# Patient Record
Sex: Female | Born: 2005 | Race: Black or African American | Hispanic: No | Marital: Single
Health system: Southern US, Community
[De-identification: ages and names within clinical notes are randomized; demographics above are authoritative.]

## PROBLEM LIST (undated history)

## (undated) DIAGNOSIS — J020 Streptococcal pharyngitis: Secondary | ICD-10-CM

## (undated) DIAGNOSIS — J45909 Unspecified asthma, uncomplicated: Secondary | ICD-10-CM

---
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
## 2023-11-16 ENCOUNTER — Emergency Department (HOSPITAL_BASED_OUTPATIENT_CLINIC_OR_DEPARTMENT_OTHER)

## 2023-11-16 ENCOUNTER — Emergency Department (HOSPITAL_BASED_OUTPATIENT_CLINIC_OR_DEPARTMENT_OTHER)
Admission: EM | Admit: 2023-11-16 | Discharge: 2023-11-16 | Disposition: A | Discharge status: Home / Self Care | Attending: Emergency Medicine | Admitting: Emergency Medicine

## 2023-11-16 ENCOUNTER — Other Ambulatory Visit: Payer: Self-pay

## 2023-11-16 ENCOUNTER — Encounter (HOSPITAL_BASED_OUTPATIENT_CLINIC_OR_DEPARTMENT_OTHER): Payer: Self-pay | Admitting: Emergency Medicine

## 2023-11-16 DIAGNOSIS — R059 Cough, unspecified: Secondary | ICD-10-CM | POA: Insufficient documentation

## 2023-11-16 LAB — RESP PANEL BY RT-PCR (RSV, FLU A&B, COVID)  RVPGX2
Influenza A by PCR: NEGATIVE
Influenza B by PCR: NEGATIVE
Resp Syncytial Virus by PCR: NEGATIVE
SARS Coronavirus 2 by RT PCR: NEGATIVE

## 2023-11-16 MED ORDER — GUAIFENESIN 100 MG/5ML PO LIQD: 100.0000 mg | ORAL | 0 refills | Status: AC | PRN

## 2023-11-16 NOTE — ED Provider Notes (Signed)
 [place] [facility] AT [facility] Provider Note   CSN: [id number] Arrival date & time: 11/16/23  1555     Patient presents with: Cough  HPI Lauren Carlson is a 18 y.o. female presenting for cough.  Started Sunday.  Also endorsing nasal congestion.  Denies fever but states the cough has been persistent it is productive with green and yellow sputum.  Concerned she may have pneumonia.  Has endorsed some chest discomfort with coughing but no chest pain or shortness of breath at this time.    Cough      Prior to Admission medications   Medication Sig Start Date End Date Taking? Authorizing Provider  guaiFENesin (ROBITUSSIN) 100 MG/5ML liquid Take 5-10 mLs (100-200 mg total) by mouth every 4 (four) hours as needed for cough or to loosen phlegm. 11/16/23  Yes Arnold Kester K, PA-C  albuterol (VENTOLIN HFA) 108 (90 Base) MCG/ACT inhaler INHALE 2 PUFF BY INHALATION ROUTE BEFORE EXERCISE AND EVERY 6 HOURS AS NEEDED WHEEZING 11/03/17   [provider]  fluticasone (FLONASE) 50 MCG/ACT nasal spray SMARTSIG:2 Spray(s) Both Nares Every Morning 02/12/20   [provider]  loratadine (CLARITIN) 10 MG tablet Take by mouth.    [provider]  Magnesium  Oxide 250 MG TABS Take 1 tablet (250 mg total) by mouth daily. 02/13/20   Abdelmoumen, Imane, MD  naproxen  (NAPROSYN ) 375 MG tablet Take 1 tablet twice daily as needed for rib pain. 03/17/20   Molpus, Tej Murdaugh, MD  ondansetron  (ZOFRAN -ODT) 8 MG disintegrating tablet PLEASE SEE ATTACHED FOR DETAILED DIRECTIONS 03/14/20   [provider]  pantoprazole  (PROTONIX ) 20 MG tablet Take 1 tablet daily while taking naproxen . 03/17/20   Molpus, Kha Hari, MD  pantoprazole  (PROTONIX ) 40 MG tablet Take 1 tablet by mouth daily. 03/14/20   [provider]  polyethylene glycol powder (GLYCOLAX /MIRALAX ) 17 GM/SCOOP powder Take by mouth. 04/01/20   [provider]  Riboflavin  100 MG TABS Take 1 tablet (100 mg  total) by mouth daily. 02/13/20   Abdelmoumen, Imane, MD  rizatriptan  (MAXALT -MLT) 10 MG disintegrating tablet Take 1 tablet (10 mg total) by mouth as needed for migraine (take 1 tablet at the headache onset, you may repeat second dose after 2 hours but no more than 2 tablets a day.). May repeat in 2 hours if needed 05/19/20   Abdelmoumen, Imane, MD  terbinafine (LAMISIL) 250 MG tablet Take 250 mg by mouth daily. 02/12/20   [provider]  tiZANidine  (ZANAFLEX ) 2 MG tablet Take 1 tablet (2 mg total) by mouth at bedtime. 05/19/20   Abdelmoumen, Imane, MD  topiramate  (TOPAMAX ) 25 MG tablet Take 2 tablets (50 mg total) by mouth at bedtime. Start with 25 mg at night for 5 days then increase to 50 mg nightly at bedtime. 05/19/20 06/18/20  Abdelmoumen, Imane, MD    Allergies: Citrus    Review of Systems  Respiratory:  Positive for cough.     Updated Vital Signs BP 116/84 (BP Location: Right Arm)   Pulse 87   Temp 98 F (36.7 C)   Resp 20   Ht 5' 1 (1.549 m)   Wt 78.9 kg   SpO2 100%   BMI 32.88 kg/m   Physical Exam Vitals and nursing note reviewed.  HENT:     Head: Normocephalic and atraumatic.     Mouth/Throat:     Mouth: Mucous membranes are moist.  Eyes:     General:        Right eye: No  discharge.        Left eye: No discharge.     Conjunctiva/sclera: Conjunctivae normal.  Cardiovascular:     Rate and Rhythm: Normal rate and regular rhythm.     Pulses: Normal pulses.     Heart sounds: Normal heart sounds.  Pulmonary:     Effort: Pulmonary effort is normal.     Breath sounds: Normal breath sounds and air entry. No decreased breath sounds, wheezing, rhonchi or rales.  Abdominal:     General: Abdomen is flat.     Palpations: Abdomen is soft.  Skin:    General: Skin is warm and dry.  Neurological:     General: No focal deficit present.  Psychiatric:        Mood and Affect: Mood normal.     (all labs ordered are listed, but only abnormal results are displayed) Labs  Reviewed  RESP PANEL BY RT-PCR (RSV, FLU A&B, COVID)  [id number]    EKG: None  Radiology: DG Chest 2 View Result Date: 11/16/2023 EXAM: 2 VIEW(S) XRAY OF THE CHEST 11/16/2023 04:55:57 PM COMPARISON: None available. CLINICAL HISTORY: prod cough FINDINGS: LUNGS AND PLEURA: No focal pulmonary opacity. No pulmonary edema. No pleural effusion. No pneumothorax. HEART AND MEDIASTINUM: No acute abnormality of the cardiac and mediastinal silhouettes. BONES AND SOFT TISSUES: No acute osseous abnormality. IMPRESSION: 1. No acute cardiopulmonary process. Electronically signed by: Maude Stammer MD 11/16/2023 05:07 PM EST RP Workstation: [id number]     Procedures   Medications Ordered in the ED - No data to display                                  Medical Decision Making Risk OTC drugs.   18 yo female presenting for cough and nasal congestion.  Exam is unremarkable.  She looks well, nontoxic and vitals are normal here.  Respiratory PCR was negative.  X-ray also without acute process.  Suspect her intermittent chest discomfort is related to persistent coughing.  Overall feel this is likely a viral process and advise supportive treatment at home.  After request sent a prescription of Robitussin to her pharmacy.  Advised PCP follow-up.  Discussed pertinent return precautions.  Discharged.     Final diagnoses:  Cough, unspecified type    ED Discharge Orders          Ordered    guaiFENesin (ROBITUSSIN) 100 MG/5ML liquid  Every 4 hours PRN        11/16/23 1726               Colleena Kurtenbach K, PA-C 11/16/23 1728    Mannie Pac T, DO 11/16/23 2253

## 2023-11-16 NOTE — ED Triage Notes (Signed)
 Pt c/o productive cough (green and yellow) since Sunday

## 2023-11-16 NOTE — ED Notes (Signed)
 Patient transported to X-ray

## 2023-11-16 NOTE — Discharge Instructions (Signed)
 Today was overall reassuring.  Suspect this is likely a viral illness.  Continue supportive treatment at home which includes rest, assertive hydration with Gatorade and water and you can take Tylenol  and ibuprofen  as needed for fever control and symptomatic relief.  X-ray was negative for acute process.  Please follow-up with your PCP.  I did send cough medicine to your pharmacy.  Would recommend taking it at night and allowing yourself to cough during the day.  If you develop worsening chest pain, shortness of breath, fever or any other concerning symptom please return to the ED for further evaluation.
